# Patient Record
Sex: Female | Born: 2005
Health system: Southern US, Community
[De-identification: ages and names within clinical notes are randomized; demographics above are authoritative.]

## PROBLEM LIST (undated history)

## (undated) DIAGNOSIS — S42309A Unspecified fracture of shaft of humerus, unspecified arm, initial encounter for closed fracture: Secondary | ICD-10-CM

---
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
## 2023-03-03 ENCOUNTER — Other Ambulatory Visit (HOSPITAL_BASED_OUTPATIENT_CLINIC_OR_DEPARTMENT_OTHER): Payer: Self-pay

## 2023-03-03 MED ORDER — NORETHIN ACE-ETH ESTRAD-FE 1-20 MG-MCG PO TABS
1.0000 | ORAL_TABLET | Freq: Every day | ORAL | 3 refills | Status: AC
  Filled 2023-03-03 – 2023-03-11 (×2): qty 84, 84d supply, fill #0
  Filled 2023-05-22: qty 84, 84d supply, fill #1

## 2023-03-10 ENCOUNTER — Other Ambulatory Visit (HOSPITAL_BASED_OUTPATIENT_CLINIC_OR_DEPARTMENT_OTHER): Payer: Self-pay

## 2023-03-11 ENCOUNTER — Other Ambulatory Visit (HOSPITAL_BASED_OUTPATIENT_CLINIC_OR_DEPARTMENT_OTHER): Payer: Self-pay

## 2023-05-22 ENCOUNTER — Other Ambulatory Visit: Payer: Self-pay

## 2023-07-23 ENCOUNTER — Other Ambulatory Visit (HOSPITAL_BASED_OUTPATIENT_CLINIC_OR_DEPARTMENT_OTHER): Payer: Self-pay

## 2023-07-23 DIAGNOSIS — Z01419 Encounter for gynecological examination (general) (routine) without abnormal findings: Secondary | ICD-10-CM | POA: Diagnosis not present

## 2023-07-23 DIAGNOSIS — Z309 Encounter for contraceptive management, unspecified: Secondary | ICD-10-CM | POA: Diagnosis not present

## 2023-07-23 DIAGNOSIS — Z681 Body mass index (BMI) 19 or less, adult: Secondary | ICD-10-CM | POA: Diagnosis not present

## 2023-07-23 MED ORDER — NORETHIN ACE-ETH ESTRAD-FE 1-20 MG-MCG PO TABS
1.0000 | ORAL_TABLET | Freq: Every day | ORAL | 3 refills | Status: DC
  Filled 2023-07-23 – 2023-08-04 (×2): qty 84, 84d supply, fill #0
  Filled 2023-10-10 – 2023-10-23 (×2): qty 84, 84d supply, fill #1
  Filled 2024-05-03: qty 84, 84d supply, fill #3

## 2023-08-04 ENCOUNTER — Other Ambulatory Visit (HOSPITAL_BASED_OUTPATIENT_CLINIC_OR_DEPARTMENT_OTHER): Payer: Self-pay

## 2023-10-01 DIAGNOSIS — Z23 Encounter for immunization: Secondary | ICD-10-CM | POA: Diagnosis not present

## 2023-10-10 ENCOUNTER — Other Ambulatory Visit (HOSPITAL_BASED_OUTPATIENT_CLINIC_OR_DEPARTMENT_OTHER): Payer: Self-pay

## 2023-10-11 ENCOUNTER — Other Ambulatory Visit (HOSPITAL_BASED_OUTPATIENT_CLINIC_OR_DEPARTMENT_OTHER): Payer: Self-pay

## 2024-01-30 ENCOUNTER — Other Ambulatory Visit (HOSPITAL_BASED_OUTPATIENT_CLINIC_OR_DEPARTMENT_OTHER): Payer: Self-pay

## 2024-03-01 DIAGNOSIS — Z0000 Encounter for general adult medical examination without abnormal findings: Secondary | ICD-10-CM | POA: Diagnosis not present

## 2024-03-04 ENCOUNTER — Ambulatory Visit (INDEPENDENT_AMBULATORY_CARE_PROVIDER_SITE_OTHER): Admitting: Podiatry

## 2024-03-04 ENCOUNTER — Encounter: Payer: Self-pay | Admitting: Podiatry

## 2024-03-04 ENCOUNTER — Other Ambulatory Visit (HOSPITAL_BASED_OUTPATIENT_CLINIC_OR_DEPARTMENT_OTHER): Payer: Self-pay

## 2024-03-04 DIAGNOSIS — B353 Tinea pedis: Secondary | ICD-10-CM

## 2024-03-04 DIAGNOSIS — L600 Ingrowing nail: Secondary | ICD-10-CM

## 2024-03-04 MED ORDER — NEOMYCIN-POLYMYXIN-HC 3.5-10000-1 OT SUSP
OTIC | 0 refills | Status: AC
  Filled 2024-03-04: qty 10, 90d supply, fill #0

## 2024-03-04 MED ORDER — KETOCONAZOLE 2 % EX CREA
1.0000 | TOPICAL_CREAM | Freq: Every day | CUTANEOUS | 2 refills | Status: AC
  Filled 2024-03-04: qty 30, 30d supply, fill #0

## 2024-03-04 NOTE — Patient Instructions (Signed)

## 2024-03-04 NOTE — Progress Notes (Signed)
  Subjective:  Patient ID: Casey Mercado, female    DOB: 2006/01/08,  MRN: [id number]  Chief Complaint  Patient presents with   Ingrown Toenail    Patient has an ingrown toe nail on left hallux     18 y.o. female presents with the above complaint. History confirmed with patient.  Right side is doing better the left side is now bothersome.  Also dealing with itching peeling skin.  She is in college.  Objective:  Physical Exam: warm, good capillary refill, no trophic changes or ulcerative lesions, normal DP and PT pulses, and normal sensory exam.  Tinea pedis bilateral with dry scaling peeling skin Left Foot:  Ingrown hallux nail, no paronychia  Assessment:   1. Ingrowing left great toenail   2. Tinea pedis of both feet      Plan:  Patient was evaluated and treated and all questions answered.    Ingrown Nail, left -Patient elects to proceed with minor surgery to remove ingrown toenail today. Consent reviewed and signed by patient. -Ingrown nail excised. See procedure note. -Educated on post-procedure care including soaking. Written instructions provided and reviewed. -Rx for Cortisporin sent to pharmacy. -Advised on signs and symptoms of infection developing.  We discussed that the phenol likely will create some redness and edema and tenderness around the nailbed as long as it is localized this is to be expected.  Will return as needed if any infection signs develop  Procedure: Excision of Ingrown Toenail Location: Left 1st toe nail Anesthesia: Lidocaine 1% plain; 1.5 mL and Marcaine 0.5% plain; 1.5 mL, digital block. Skin Prep: Betadine. Dressing: Silvadene; telfa; dry, sterile, compression dressing. Technique: Following skin prep, the toe was exsanguinated and a tourniquet was secured at the base of the toe. The affected nail border was freed, split with a nail splitter, and excised. Chemical matrixectomy was then performed with phenol and irrigated out with alcohol. The  tourniquet was then removed and sterile dressing applied. Disposition: Patient tolerated procedure well.   Discussed the etiology and treatment options for tinea pedis.  Discussed topical and oral treatment.  Recommended topical treatment with 2% ketoconazole cream.  This was sent to the patient's pharmacy.  Also discussed appropriate foot hygiene, use of antifungal spray such as Tinactin in shoes, as well as cleaning her foot surfaces such as showers and bathroom floors with bleach.  Return if symptoms worsen or fail to improve.

## 2024-03-05 ENCOUNTER — Other Ambulatory Visit: Payer: Self-pay

## 2024-04-27 ENCOUNTER — Other Ambulatory Visit (HOSPITAL_BASED_OUTPATIENT_CLINIC_OR_DEPARTMENT_OTHER): Payer: Self-pay

## 2024-04-27 MED ORDER — IBUPROFEN 600 MG PO TABS
600.0000 mg | ORAL_TABLET | Freq: Four times a day (QID) | ORAL | 0 refills | Status: AC | PRN
  Filled 2024-04-28: qty 20, 5d supply, fill #0

## 2024-04-27 MED ORDER — HYDROCODONE-ACETAMINOPHEN 7.5-325 MG PO TABS
1.0000 | ORAL_TABLET | ORAL | 0 refills | Status: AC | PRN
  Filled 2024-04-28: qty 10, 2d supply, fill #0

## 2024-04-27 MED ORDER — CEPHALEXIN 500 MG PO CAPS
ORAL_CAPSULE | ORAL | 0 refills | Status: AC
  Filled 2024-04-28: qty 19, 5d supply, fill #0

## 2024-04-28 ENCOUNTER — Other Ambulatory Visit (HOSPITAL_BASED_OUTPATIENT_CLINIC_OR_DEPARTMENT_OTHER): Payer: Self-pay

## 2024-05-03 ENCOUNTER — Other Ambulatory Visit: Payer: Self-pay

## 2024-07-23 ENCOUNTER — Other Ambulatory Visit (HOSPITAL_BASED_OUTPATIENT_CLINIC_OR_DEPARTMENT_OTHER): Payer: Self-pay

## 2024-07-23 MED ORDER — METHYLPREDNISOLONE 4 MG PO TBPK
ORAL_TABLET | ORAL | 0 refills | Status: AC
  Filled 2024-07-23: qty 21, 6d supply, fill #0

## 2024-07-28 ENCOUNTER — Other Ambulatory Visit (HOSPITAL_BASED_OUTPATIENT_CLINIC_OR_DEPARTMENT_OTHER): Payer: Self-pay

## 2024-07-28 DIAGNOSIS — Z113 Encounter for screening for infections with a predominantly sexual mode of transmission: Secondary | ICD-10-CM | POA: Diagnosis not present

## 2024-07-28 DIAGNOSIS — Z681 Body mass index (BMI) 19 or less, adult: Secondary | ICD-10-CM | POA: Diagnosis not present

## 2024-07-28 DIAGNOSIS — Z309 Encounter for contraceptive management, unspecified: Secondary | ICD-10-CM | POA: Diagnosis not present

## 2024-07-28 DIAGNOSIS — Z01419 Encounter for gynecological examination (general) (routine) without abnormal findings: Secondary | ICD-10-CM | POA: Diagnosis not present

## 2024-07-28 MED ORDER — NORETHIN ACE-ETH ESTRAD-FE 1-20 MG-MCG PO TABS
1.0000 | ORAL_TABLET | Freq: Every day | ORAL | 3 refills | Status: AC
  Filled 2024-07-28: qty 84, 84d supply, fill #0
  Filled 2024-10-07: qty 84, 84d supply, fill #1
  Filled 2024-12-24: qty 84, 84d supply, fill #2

## 2024-08-19 ENCOUNTER — Other Ambulatory Visit (HOSPITAL_BASED_OUTPATIENT_CLINIC_OR_DEPARTMENT_OTHER): Payer: Self-pay

## 2024-08-19 MED ORDER — AMOXICILLIN 500 MG PO CAPS
500.0000 mg | ORAL_CAPSULE | Freq: Three times a day (TID) | ORAL | 0 refills | Status: DC
  Filled 2024-08-19: qty 21, 7d supply, fill #0

## 2024-09-08 ENCOUNTER — Other Ambulatory Visit (HOSPITAL_BASED_OUTPATIENT_CLINIC_OR_DEPARTMENT_OTHER): Payer: Self-pay

## 2024-09-08 MED ORDER — AMOXICILLIN 500 MG PO CAPS
500.0000 mg | ORAL_CAPSULE | Freq: Three times a day (TID) | ORAL | 0 refills | Status: AC
  Filled 2024-09-08: qty 21, 7d supply, fill #0

## 2024-09-17 DIAGNOSIS — Z23 Encounter for immunization: Secondary | ICD-10-CM | POA: Diagnosis not present

## 2024-10-08 ENCOUNTER — Other Ambulatory Visit (HOSPITAL_BASED_OUTPATIENT_CLINIC_OR_DEPARTMENT_OTHER): Payer: Self-pay

## 2024-11-12 ENCOUNTER — Other Ambulatory Visit (HOSPITAL_BASED_OUTPATIENT_CLINIC_OR_DEPARTMENT_OTHER): Payer: Self-pay

## 2024-11-15 ENCOUNTER — Other Ambulatory Visit (HOSPITAL_BASED_OUTPATIENT_CLINIC_OR_DEPARTMENT_OTHER): Payer: Self-pay

## 2024-11-15 MED ORDER — VALACYCLOVIR HCL 1 G PO TABS
2000.0000 mg | ORAL_TABLET | Freq: Two times a day (BID) | ORAL | 2 refills | Status: AC | PRN
  Filled 2024-11-15: qty 60, 15d supply, fill #0
